# Patient Record
Sex: Male | Born: 1990 | Race: White | Hispanic: No | Marital: Married | Smoking: Former smoker
Health system: Southern US, Community
[De-identification: ages and names within clinical notes are randomized; demographics above are authoritative.]

---

## 2012-03-31 ENCOUNTER — Emergency Department: Payer: Self-pay | Admitting: Emergency Medicine

## 2013-01-31 ENCOUNTER — Emergency Department: Payer: Self-pay | Admitting: Emergency Medicine

## 2013-08-20 ENCOUNTER — Emergency Department: Payer: Self-pay | Admitting: Emergency Medicine

## 2013-10-22 ENCOUNTER — Emergency Department: Payer: Self-pay | Admitting: Emergency Medicine

## 2013-10-25 LAB — BETA STREP CULTURE(ARMC)

## 2013-12-09 ENCOUNTER — Emergency Department: Payer: Self-pay | Admitting: Emergency Medicine

## 2013-12-09 LAB — BASIC METABOLIC PANEL
Anion Gap: 4 — ABNORMAL LOW (ref 7–16)
BUN: 15 mg/dL (ref 7–18)
CREATININE: 1.1 mg/dL (ref 0.60–1.30)
Calcium, Total: 8.9 mg/dL (ref 8.5–10.1)
Chloride: 106 mmol/L (ref 98–107)
Co2: 30 mmol/L (ref 21–32)
EGFR (African American): >60
EGFR (Non-African Amer.): >60
GLUCOSE: 109 mg/dL — AB (ref 65–99)
Osmolality: 281 (ref 275–301)
POTASSIUM: 4.4 mmol/L (ref 3.5–5.1)
Sodium: 140 mmol/L (ref 136–145)

## 2013-12-09 LAB — CBC WITH DIFFERENTIAL/PLATELET
Basophil #: 0 10*3/uL (ref 0.0–0.1)
Basophil %: 0.2 %
EOS PCT: 0.3 %
Eosinophil #: 0 10*3/uL (ref 0.0–0.7)
HCT: 52.1 % — ABNORMAL HIGH (ref 40.0–52.0)
HGB: 16.9 g/dL (ref 13.0–18.0)
LYMPHS ABS: 0.2 10*3/uL — AB (ref 1.0–3.6)
Lymphocyte %: 1.4 %
MCH: 28.4 pg (ref 26.0–34.0)
MCHC: 32.4 g/dL (ref 32.0–36.0)
MCV: 88 fL (ref 80–100)
MONOS PCT: 4.5 %
Monocyte #: 0.7 x10 3/mm (ref 0.2–1.0)
NEUTROS ABS: 15.4 10*3/uL — AB (ref 1.4–6.5)
NEUTROS PCT: 93.6 %
PLATELETS: 220 10*3/uL (ref 150–440)
RBC: 5.95 10*6/uL — ABNORMAL HIGH (ref 4.40–5.90)
RDW: 13.3 % (ref 11.5–14.5)
WBC: 16.4 10*3/uL — AB (ref 3.8–10.6)

## 2013-12-09 LAB — LIPASE, BLOOD: Lipase: 140 U/L (ref 73–393)

## 2014-05-14 ENCOUNTER — Emergency Department: Payer: Self-pay | Admitting: Emergency Medicine

## 2015-03-28 ENCOUNTER — Encounter: Payer: Self-pay | Admitting: Emergency Medicine

## 2015-03-28 ENCOUNTER — Emergency Department
Admission: EM | Admit: 2015-03-28 | Discharge: 2015-03-28 | Disposition: A | Discharge status: Home / Self Care | Payer: Self-pay | Attending: Emergency Medicine | Admitting: Emergency Medicine

## 2015-03-28 DIAGNOSIS — H9192 Unspecified hearing loss, left ear: Secondary | ICD-10-CM | POA: Insufficient documentation

## 2015-03-28 DIAGNOSIS — Z87891 Personal history of nicotine dependence: Secondary | ICD-10-CM | POA: Insufficient documentation

## 2015-03-28 NOTE — ED Notes (Signed)
Pt c/o muffled hearing in his left ear for about a month; denies pain; says his brother in law made him come tonight; pt in no acute distress

## 2015-03-31 DIAGNOSIS — H6123 Impacted cerumen, bilateral: Secondary | ICD-10-CM | POA: Insufficient documentation

## 2015-03-31 DIAGNOSIS — L237 Allergic contact dermatitis due to plants, except food: Secondary | ICD-10-CM | POA: Insufficient documentation

## 2015-03-31 DIAGNOSIS — Z87891 Personal history of nicotine dependence: Secondary | ICD-10-CM | POA: Insufficient documentation

## 2015-03-31 NOTE — ED Notes (Signed)
Right ear muffled for few days, has had nasal congestion.

## 2015-04-01 ENCOUNTER — Emergency Department
Admission: EM | Admit: 2015-04-01 | Discharge: 2015-04-01 | Disposition: A | Discharge status: Home / Self Care | Payer: Self-pay | Attending: Emergency Medicine | Admitting: Emergency Medicine

## 2015-04-01 DIAGNOSIS — L237 Allergic contact dermatitis due to plants, except food: Secondary | ICD-10-CM

## 2015-04-01 DIAGNOSIS — H6123 Impacted cerumen, bilateral: Secondary | ICD-10-CM

## 2015-04-01 MED ORDER — PREDNISONE 20 MG PO TABS
60.0000 mg | ORAL_TABLET | Freq: Once | ORAL | Status: AC
  Administered 2015-04-01: 60 mg via ORAL
  Filled 2015-04-01: qty 3

## 2015-04-01 MED ORDER — CARBAMIDE PEROXIDE 6.5 % OT SOLN: 5.0000 [drp] | Freq: Two times a day (BID) | OTIC | Status: AC

## 2015-04-01 MED ORDER — PREDNISONE 20 MG PO TABS: 60.0000 mg | ORAL_TABLET | Freq: Every day | ORAL | Status: DC

## 2015-04-01 NOTE — Discharge Instructions (Signed)
Contact Dermatitis °Dermatitis is redness, soreness, and swelling (inflammation) of the skin. Contact dermatitis is a reaction to certain substances that touch the skin. There are two types of contact dermatitis:  °· Irritant contact dermatitis. This type is caused by something that irritates your skin, such as dry hands from washing them too much. This type does not require previous exposure to the substance for a reaction to occur. This type is more common. °· Allergic contact dermatitis. This type is caused by a substance that you are allergic to, such as a nickel allergy or poison ivy. This type only occurs if you have been exposed to the substance (allergen) before. Upon a repeat exposure, your body reacts to the substance. This type is less common. °CAUSES  °Many different substances can cause contact dermatitis. Irritant contact dermatitis is most commonly caused by exposure to:  °· Makeup.   °· Soaps.   °· Detergents.   °· Bleaches.   °· Acids.   °· Metal salts, such as nickel.   °Allergic contact dermatitis is most commonly caused by exposure to:  °· Poisonous plants.   °· Chemicals.   °· Jewelry.   °· Latex.   °· Medicines.   °· Preservatives in products, such as clothing.   °RISK FACTORS °This condition is more likely to develop in:  °· People who have jobs that expose them to irritants or allergens. °· People who have certain medical conditions, such as asthma or eczema.   °SYMPTOMS  °Symptoms of this condition may occur anywhere on your body where the irritant has touched you or is touched by you. Symptoms include: °· Dryness or flaking.   °· Redness.   °· Cracks.   °· Itching.   °· Pain or a burning feeling.   °· Blisters. °· Drainage of small amounts of blood or clear fluid from skin cracks. °With allergic contact dermatitis, there may also be swelling in areas such as the eyelids, mouth, or genitals.  °DIAGNOSIS  °This condition is diagnosed with a medical history and physical exam. A patch skin test  may be performed to help determine the cause. If the condition is related to your job, you may need to see an occupational medicine specialist. °TREATMENT °Treatment for this condition includes figuring out what caused the reaction and protecting your skin from further contact. Treatment may also include:  °· Steroid creams or ointments. Oral steroid medicines may be needed in more severe cases. °· Antibiotics or antibacterial ointments, if a skin infection is present. °· Antihistamine lotion or an antihistamine taken by mouth to ease itching. °· A bandage (dressing). °HOME CARE INSTRUCTIONS °Skin Care  °· Moisturize your skin as needed.   °· Apply cool compresses to the affected areas. °· Try taking a bath with: °¨ Epsom salts. Follow the instructions on the packaging. You can get these at your local pharmacy or grocery store. °¨ Baking soda. Pour a small amount into the bath as directed by your health care provider. °¨ Colloidal oatmeal. Follow the instructions on the packaging. You can get this at your local pharmacy or grocery store. °· Try applying baking soda paste to your skin. Stir water into baking soda until it reaches a paste-like consistency. °· Do not scratch your skin. °· Bathe less frequently, such as every other day. °· Bathe in lukewarm water. Avoid using hot water. °Medicines  °· Take or apply over-the-counter and prescription medicines only as told by your health care provider.   °· If you were prescribed an antibiotic medicine, take or apply your antibiotic as told by your health care provider. Do not stop using the   antibiotic even if your condition starts to improve. °General Instructions  °· Keep all follow-up visits as told by your health care provider. This is important. °· Avoid the substance that caused your reaction. If you do not know what caused it, keep a journal to try to track what caused it. Write down: °¨ What you eat. °¨ What cosmetic products you use. °¨ What you drink. °¨ What  you wear in the affected area. This includes jewelry. °· If you were given a dressing, take care of it as told by your health care provider. This includes when to change and remove it. °SEEK MEDICAL CARE IF:  °· Your condition does not improve with treatment. °· Your condition gets worse. °· You have signs of infection such as swelling, tenderness, redness, soreness, or warmth in the affected area. °· You have a fever. °· You have new symptoms. °SEEK IMMEDIATE MEDICAL CARE IF:  °· You have a severe headache, neck pain, or neck stiffness. °· You vomit. °· You feel very sleepy. °· You notice red streaks coming from the affected area. °· Your bone or joint underneath the affected area becomes painful after the skin has healed. °· The affected area turns darker. °· You have difficulty breathing. °  °This information is not intended to replace advice given to you by your health care provider. Make sure you discuss any questions you have with your health care provider. °  °Document Released: 01/29/2000 Document Revised: 10/22/2014 Document Reviewed: 06/18/2014 °Elsevier Interactive Patient Education ©2016 [employer] ° °Poison Ivy °Poison ivy is a inflammation of the skin (contact dermatitis) caused by touching the allergens on the leaves of the ivy plant following previous exposure to the plant. The rash usually appears 48 hours after exposure. The rash is usually bumps (papules) or blisters (vesicles) in a linear pattern. Depending on your own sensitivity, the rash may simply cause redness and itching, or it may also progress to blisters which may break open. These must be well cared for to prevent secondary bacterial (germ) infection, followed by scarring. Keep any open areas dry, clean, dressed, and covered with an antibacterial ointment if needed. The eyes may also get puffy. The puffiness is worst in the morning and gets better as the day progresses. This dermatitis usually heals without scarring, within 2 to 3  weeks without treatment. °HOME CARE INSTRUCTIONS  °Thoroughly wash with soap and water as soon as you have been exposed to poison ivy. You have about one half hour to remove the plant resin before it will cause the rash. This washing will destroy the oil or antigen on the skin that is causing, or will cause, the rash. Be sure to wash under your fingernails as any plant resin there will continue to spread the rash. Do not rub skin vigorously when washing affected area. Poison ivy cannot spread if no oil from the plant remains on your body. A rash that has progressed to weeping sores will not spread the rash unless you have not washed thoroughly. It is also important to wash any clothes you have been wearing as these may carry active allergens. The rash will return if you wear the unwashed clothing, even several days later. °Avoidance of the plant in the future is the best measure. Poison ivy plant can be recognized by the number of leaves. Generally, poison ivy has three leaves with flowering branches on a single stem. °Diphenhydramine may be purchased over the counter and used as needed for itching. Do not   drive with this medication if it makes you drowsy.Ask your caregiver about medication for children. SEEK MEDICAL CARE IF:  Open sores develop.  Redness spreads beyond area of rash.  You notice purulent (pus-like) discharge.  You have increased pain.  Other signs of infection develop (such as fever).   This information is not intended to replace advice given to you by your health care provider. Make sure you discuss any questions you have with your health care provider.   Document Released: 01/29/2000 Document Revised: 04/25/2011 Document Reviewed: 07/09/2014 Elsevier Interactive Patient Education 2016 [employer] Inc[employer]  Cerumen Impaction The structures of the external ear canal secrete a waxy substance known as cerumen. Excess cerumen can build up in the ear canal, causing a condition known as  cerumen impaction. Cerumen impaction can cause ear pain and disrupt the function of the ear. The rate of cerumen production differs for each individual. In certain individuals, the configuration of the ear canal may decrease his or her ability to naturally remove cerumen. CAUSES Cerumen impaction is caused by excessive cerumen production or buildup. RISK FACTORS  Frequent use of swabs to clean ears.  Having narrow ear canals.  Having eczema.  Being dehydrated. SIGNS AND SYMPTOMS  Diminished hearing.  Ear drainage.  Ear pain.  Ear itch. TREATMENT Treatment may involve:  Over-the-counter or prescription ear drops to soften the cerumen.  Removal of cerumen by a health care provider. This may be done with:  Irrigation with warm water. This is the most common method of removal.  Ear curettes and other instruments.  Surgery. This may be done in severe cases. HOME CARE INSTRUCTIONS  Take medicines only as directed by your health care provider.  Do not insert objects into the ear with the intent of cleaning the ear. PREVENTION  Do not insert objects into the ear, even with the intent of cleaning the ear. Removing cerumen as a part of normal hygiene is not necessary, and the use of swabs in the ear canal is not recommended.  Drink enough water to keep your urine clear or pale yellow.  Control your eczema if you have it. SEEK MEDICAL CARE IF:  You develop ear pain.  You develop bleeding from the ear.  The cerumen does not clear after you use ear drops as directed.   This information is not intended to replace advice given to you by your health care provider. Make sure you discuss any questions you have with your health care provider.   Document Released: 03/10/2004 Document Revised: 02/21/2014 Document Reviewed: 09/17/2014 Elsevier Interactive Patient Education [employer]

## 2015-04-01 NOTE — ED Provider Notes (Signed)
[facility] Emergency Department Provider Note  ____________________________________________  Time seen: Approximately 0057 AM  I have reviewed the triage vital signs and the nursing notes.   HISTORY  Chief Complaint Ear Fullness    HPI Craig Berg is a 25 y.o. male who comes into the hospital today with weird sensation in his ear. The patient reports that he's been feeling this for the past 1-2 months. He reports that he can hardly hear out of both of his ears. His left worse than right. He also reports that he's had a bad reaction to poison oak and poison ivy in the past and has another exposure and would like to get that taken care of. He reports that when he puts in his headphones the sounds are muffled and then he has also turned up the television loudly to hear it. The patient was unsure if it was due to sinuses or something else and he wanted to get it checked out. The patient is currently having no pain he's had no fevers. He reports that he does have the rash to his face and legs and arms. He is here for evaluation.   No past medical history  There are no active problems to display for this patient.   No past surgical history  Current Outpatient Rx  Name  Route  Sig  Dispense  Refill  . carbamide peroxide (DEBROX) 6.5 % otic solution   Right Ear   Place 5 drops into the right ear 2 (two) times daily.   15 mL   0   . predniSONE (DELTASONE) 20 MG tablet   Oral   Take 3 tablets (60 mg total) by mouth daily.   12 tablet   0     Allergies Review of patient's allergies indicates no known allergies.  No family history on file.  Social History Social History  Substance Use Topics  . Smoking status: Former Games developer  . Smokeless tobacco: Not on file  . Alcohol Use: Yes    Review of Systems Constitutional: No fever/chills Eyes: No visual changes. ENT: Decreased hearing Cardiovascular: Denies chest pain. Respiratory: Denies shortness of  breath. Gastrointestinal: No abdominal pain.  No nausea, no vomiting.  No diarrhea.  No constipation. Genitourinary: Negative for dysuria. Musculoskeletal: Negative for back pain. Skin:  rash. Neurological: Negative for headaches, focal weakness or numbness.  10-point ROS otherwise negative.  ____________________________________________   PHYSICAL EXAM:  VITAL SIGNS: ED Triage Vitals  Enc Vitals Group     BP 03/31/15 2312 159/91 mmHg     Pulse Rate 03/31/15 2312 89     Resp 03/31/15 2312 18     Temp 03/31/15 2312 97.8 F (36.6 C)     Temp Source 03/31/15 2312 Oral     SpO2 03/31/15 2312 97 %     Weight 03/31/15 2312 210 lb (95.255 kg)     Height --      Head Cir --      Peak Flow --      Pain Score 03/31/15 2312 0     Pain Loc --      Pain Edu? --      Excl. in GC? --     Constitutional: Alert and oriented. Well appearing and in no acute distress. Eyes: Conjunctivae are normal. PERRL. EOMI. Ears: Cerumen impaction bilaterally Head: Atraumatic. Nose: No congestion/rhinnorhea. Mouth/Throat: Mucous membranes are moist.  Oropharynx non-erythematous. Cardiovascular: Normal rate, regular rhythm. Grossly normal heart sounds.  Good peripheral circulation.  Respiratory: Normal respiratory effort.  No retractions. Lungs CTAB. Gastrointestinal: Soft and nontender. No distention. Positive bowel sounds Musculoskeletal: No lower extremity tenderness nor edema.   Neurologic:  Normal speech and language.  Skin:  Skin is warm, dry and intact. Maculopapular rash to the patient's arms and legs without any specific vesicles or bullae. Psychiatric: Mood and affect are normal.   ____________________________________________   LABS (all labs ordered are listed, but only abnormal results are displayed)  Labs Reviewed - No data to  display ____________________________________________  EKG  none ____________________________________________  RADIOLOGY  none ____________________________________________   PROCEDURES  Procedure(s) performed: None  Critical Care performed: No  ____________________________________________   INITIAL IMPRESSION / ASSESSMENT AND PLAN / ED COURSE  Pertinent labs & imaging results that were available during my care of the patient were reviewed by me and considered in my medical decision making (see chart for details).  This is a 25 year old male who comes into the hospital today with some decreased hearing in his ears and a rash. I will give the patient some prednisone for his poison ivy rash and I will attempt to clean out the patient cerumen impaction.  I attempted to clean out the patient's left ear with a curet. I did obtain copious amounts of cerumen but there was still some remaining. I also attempted to clean the patient's right ear that began was unable to clear his ear. The patient will be given a prescription for Debrox drops as well as prednisone and he'll be discharged to home. ____________________________________________   FINAL CLINICAL IMPRESSION(S) / ED DIAGNOSES  Final diagnoses:  Cerumen impaction, bilateral  Poison ivy      Rebecka Apley, MD 04/01/15 [phone]

## 2015-10-04 ENCOUNTER — Emergency Department
Admission: EM | Admit: 2015-10-04 | Discharge: 2015-10-04 | Disposition: A | Discharge status: Home / Self Care | Payer: Self-pay | Attending: Emergency Medicine | Admitting: Emergency Medicine

## 2015-10-04 ENCOUNTER — Encounter: Payer: Self-pay | Admitting: Emergency Medicine

## 2015-10-04 ENCOUNTER — Emergency Department: Payer: Self-pay

## 2015-10-04 DIAGNOSIS — Y999 Unspecified external cause status: Secondary | ICD-10-CM | POA: Insufficient documentation

## 2015-10-04 DIAGNOSIS — W2102XA Struck by soccer ball, initial encounter: Secondary | ICD-10-CM | POA: Insufficient documentation

## 2015-10-04 DIAGNOSIS — Y9366 Activity, soccer: Secondary | ICD-10-CM | POA: Insufficient documentation

## 2015-10-04 DIAGNOSIS — Z7952 Long term (current) use of systemic steroids: Secondary | ICD-10-CM | POA: Insufficient documentation

## 2015-10-04 DIAGNOSIS — S93105A Unspecified dislocation of left toe(s), initial encounter: Secondary | ICD-10-CM

## 2015-10-04 DIAGNOSIS — Y929 Unspecified place or not applicable: Secondary | ICD-10-CM | POA: Insufficient documentation

## 2015-10-04 DIAGNOSIS — Z87891 Personal history of nicotine dependence: Secondary | ICD-10-CM | POA: Insufficient documentation

## 2015-10-04 DIAGNOSIS — S93135A Subluxation of interphalangeal joint of left lesser toe(s), initial encounter: Secondary | ICD-10-CM | POA: Insufficient documentation

## 2015-10-04 DIAGNOSIS — Z79899 Other long term (current) drug therapy: Secondary | ICD-10-CM | POA: Insufficient documentation

## 2015-10-04 MED ORDER — TRAMADOL HCL 50 MG PO TABS: 50.0000 mg | ORAL_TABLET | Freq: Four times a day (QID) | ORAL | 0 refills | Status: DC | PRN

## 2015-10-04 NOTE — ED Triage Notes (Signed)
Pt states was playing soccer when he ran his foot into his sister's shoe. States he thinks his L pointer toe is dislocated. Some redness and swelling noted at this time.

## 2015-10-04 NOTE — ED Provider Notes (Signed)
[facility]-EMERGENCY DEPARTMENT Provider Note   CSN: [id number] Arrival date & time: 10/04/15  1951     History   Chief Complaint Chief Complaint  Patient presents with  . Toe Pain    HPI Craig Berg is a 25 y.o. male presents to the emergency department for evaluation of left second toe pain. Patient states around 7 PM tonight, he was playing soccer barefooted. Patient states he went to kick the ball, ended up kicking his sister's foot. Patient developed toe to the second PIP joint of the left foot. Patient's pain is moderate. No relief with ibuprofen. He has pain with weightbearing. He denies any numbness or tingling. No other injury to his body.  HPI  History reviewed. No pertinent past medical history.  There are no active problems to display for this patient.   History reviewed. No pertinent surgical history.     Home Medications    Prior to Admission medications   Medication Sig Start Date End Date Taking? Authorizing Provider  carbamide peroxide (DEBROX) 6.5 % otic solution Place 5 drops into the right ear 2 (two) times daily. 04/01/15 03/31/16  Rebecka ApleyAllison P Webster, MD  predniSONE (DELTASONE) 20 MG tablet Take 3 tablets (60 mg total) by mouth daily. 04/01/15   Rebecka ApleyAllison P Webster, MD  traMADol (ULTRAM) 50 MG tablet Take 1 tablet (50 mg total) by mouth every 6 (six) hours as needed. 10/04/15   Evon Slackhomas C Verbon Giangregorio, PA-C    Family History History reviewed. No pertinent family history.  Social History Social History  Substance Use Topics  . Smoking status: Former Games developermoker  . Smokeless tobacco: Former NeurosurgeonUser  . Alcohol use Yes     Allergies   Review of patient's allergies indicates no known allergies.   Review of Systems Review of Systems  Constitutional: Negative.  Negative for activity change, appetite change, chills and fever.  HENT: Negative for congestion, ear pain, mouth sores, rhinorrhea, sinus pressure, sore throat and trouble swallowing.   Eyes: Negative for  photophobia, pain and discharge.  Respiratory: Negative for cough, chest tightness and shortness of breath.   Cardiovascular: Negative for chest pain and leg swelling.  Gastrointestinal: Negative for abdominal distention, abdominal pain, diarrhea, nausea and vomiting.  Genitourinary: Negative for difficulty urinating and dysuria.  Musculoskeletal: Positive for arthralgias and joint swelling. Negative for back pain and gait problem.  Skin: Negative for color change and rash.  Neurological: Negative for dizziness and headaches.  Hematological: Negative for adenopathy.  Psychiatric/Behavioral: Negative for agitation and behavioral problems.     Physical Exam Updated Vital Signs BP 132/72 (BP Location: Left Arm)   Pulse 92   Temp 97.6 F (36.4 C) (Oral)   Resp 18   Ht 5\' 11"  (1.803 m)   Wt 90.7 kg   SpO2 96%   BMI 27.89 kg/m   Physical Exam  Constitutional: He appears well-developed and well-nourished.  HENT:  Head: Normocephalic and atraumatic.  Eyes: Conjunctivae are normal.  Neck: Neck supple.  Cardiovascular: Normal rate.   No murmur heard. Pulmonary/Chest: Effort normal. No respiratory distress.  Musculoskeletal:  Examination of the left second toe shows patient is tender to palpation. There is a small deformity that is visible and palpable. There is no nail trauma. Sensation is intact distally. No skin breakdown noted.  Neurological: He is alert.  Skin: Skin is warm and dry.  Psychiatric: He has a normal mood and affect.  Nursing note and vitals reviewed.    ED Treatments / Results  Labs (  all labs ordered are listed, but only abnormal results are displayed) Labs Reviewed - No data to display  EKG  EKG Interpretation None       Radiology Dg Foot Complete Left  Result Date: 10/04/2015 CLINICAL DATA:  Left second toe pain after injury playing soccer today. EXAM: LEFT FOOT - COMPLETE 3+ VIEW COMPARISON:  None. FINDINGS: There is dorsal subluxation of the  second toe at the proximal interphalangeal joint with the middle phalanx perched dorsally on the proximal phalanx. Small osseous fragment adjacent to the lateral joint line, donor site likely from the middle phalanx. There is a questionable osseous density adjacent to the medial joint line. Alignment partially obscured on the lateral view due to overlapping osseous and soft tissue structures. No additional acute fracture or subluxation of the foot. IMPRESSION: Dorsal subluxation of the second toe at the proximal interphalangeal joint. Probable tiny fracture fragment in the lateral joint line, donor site likely the middle phalanx. Additional questionable osseous fragment adjacent to the medial joint line, seen only on a single view. Electronically Signed   By: Rubye OaksMelanie  Ehinger M.D.   On: 10/04/2015 21:02   Dg Toe 2nd Left  Result Date: 10/04/2015 CLINICAL DATA:  Post reduction. EXAM: LEFT SECOND TOE COMPARISON:  October 04, 2015 FINDINGS: The second toe dislocations/subluxation has been reduced. Tiny calcifications both medially and laterally adjacent to the proximal interphalangeal joint are probably small fracture fragments. No other abnormalities. IMPRESSION: Interval reduction of the second toe subluxation/dislocation. Tiny adjacent calcifications are likely small fracture fragments. Electronically Signed   By: Gerome Samavid  Williams III M.D   On: 10/04/2015 21:13    Procedures Procedures (including critical care time) Closed reduction left second toe: Risks, benefits, complications of a closed reduction to the left second toe were discussed with the patient. Patient agreed and consented to the procedure. Discussed benefits of digital block with patient, patient elected not to pursue digital block. Traction was applied to the left second toe and volar placement was applied. Patient tolerated procedure well.  Medications Ordered in ED Medications - No data to display   Initial Impression / Assessment and  Plan / ED Course  I have reviewed the triage vital signs and the nursing notes.  Pertinent labs & imaging results that were available during my care of the patient were reviewed by me and considered in my medical decision making (see chart for details).  Clinical Course    25 year old male with left second PIP toe dislocation. Patient tolerated closed reduction well. There is mild calcifications along the reduction site. Patient's toes were buddy taped. His given tramadol for pain. Follow-up with podiatry.  Final Clinical Impressions(s) / ED Diagnoses   Final diagnoses:  Toe dislocation, left, initial encounter    New Prescriptions New Prescriptions   TRAMADOL (ULTRAM) 50 MG TABLET    Take 1 tablet (50 mg total) by mouth every 6 (six) hours as needed.     Evon Slackhomas C Vineta Carone, PA-C 10/04/15 2132    Sharyn CreamerMark Quale, MD 10/04/15 2351

## 2015-10-04 NOTE — ED Notes (Signed)
NAD noted at time of D/C. Pt rolled himself out to lobby in a wheelchair, refused assistance at this time. Denies comments/concerns.

## 2015-10-04 NOTE — Discharge Instructions (Signed)
Please rest ice and elevate toe. Buddy tape toes daily for 2-3 weeks. Ibuprofen as needed for pain

## 2018-02-18 IMAGING — DX DG TOE 2ND 2+V*L*
3 series · 3 of 3 positions shown · non-contrast
Comparison: October 04, 2015

CLINICAL DATA: Post reduction.

EXAM:
LEFT SECOND TOE

[toe ap]
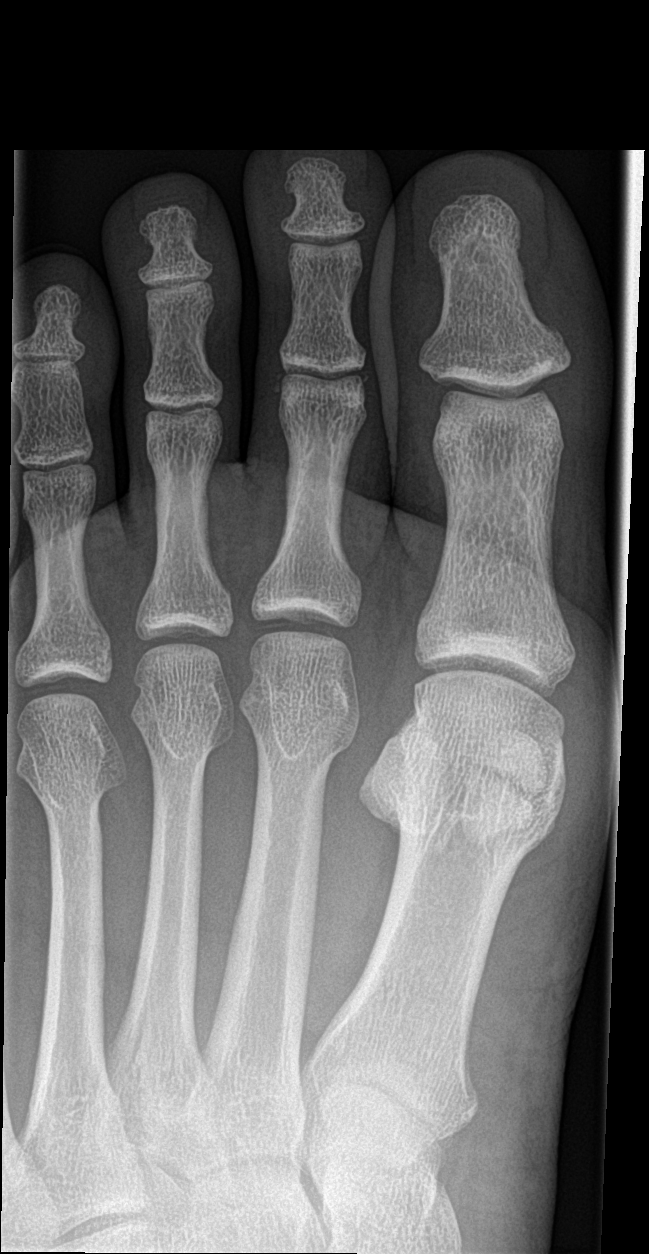

[toe obl]
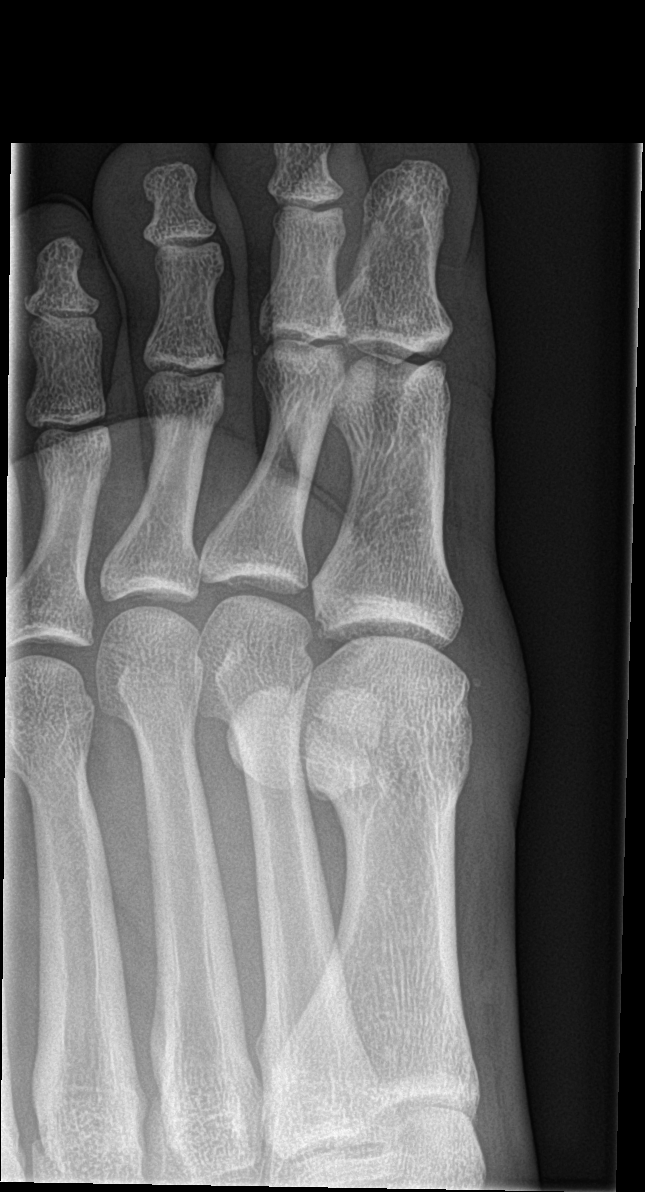

[toe lat]
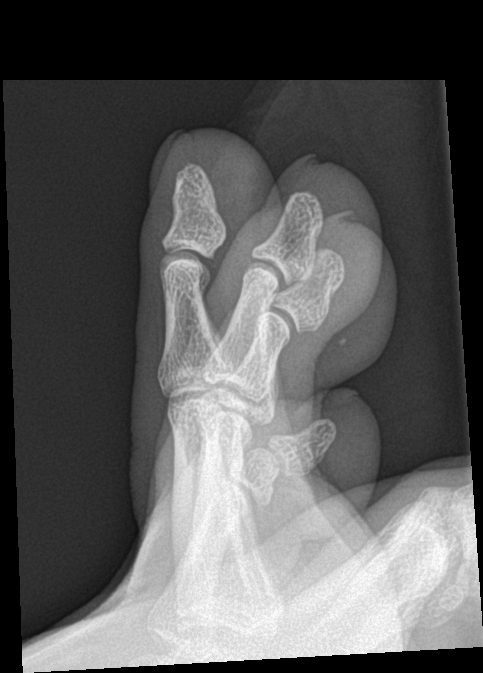

[3 of 3 positions shown; findings below may reference images not displayed]

FINDINGS: The second toe dislocations/subluxation has been reduced. Tiny
calcifications both medially and laterally adjacent to the proximal
interphalangeal joint are probably small fracture fragments. No
other abnormalities.
IMPRESSION: Interval reduction of the second toe subluxation/dislocation. Tiny
adjacent calcifications are likely small fracture fragments.

## 2018-02-18 IMAGING — DX DG FOOT COMPLETE 3+V*L*
3 series · 3 of 3 positions shown · non-contrast
Comparison: None.

CLINICAL DATA: Left second toe pain after injury playing soccer
today.

EXAM:
LEFT FOOT - COMPLETE 3+ VIEW

[foot ap]
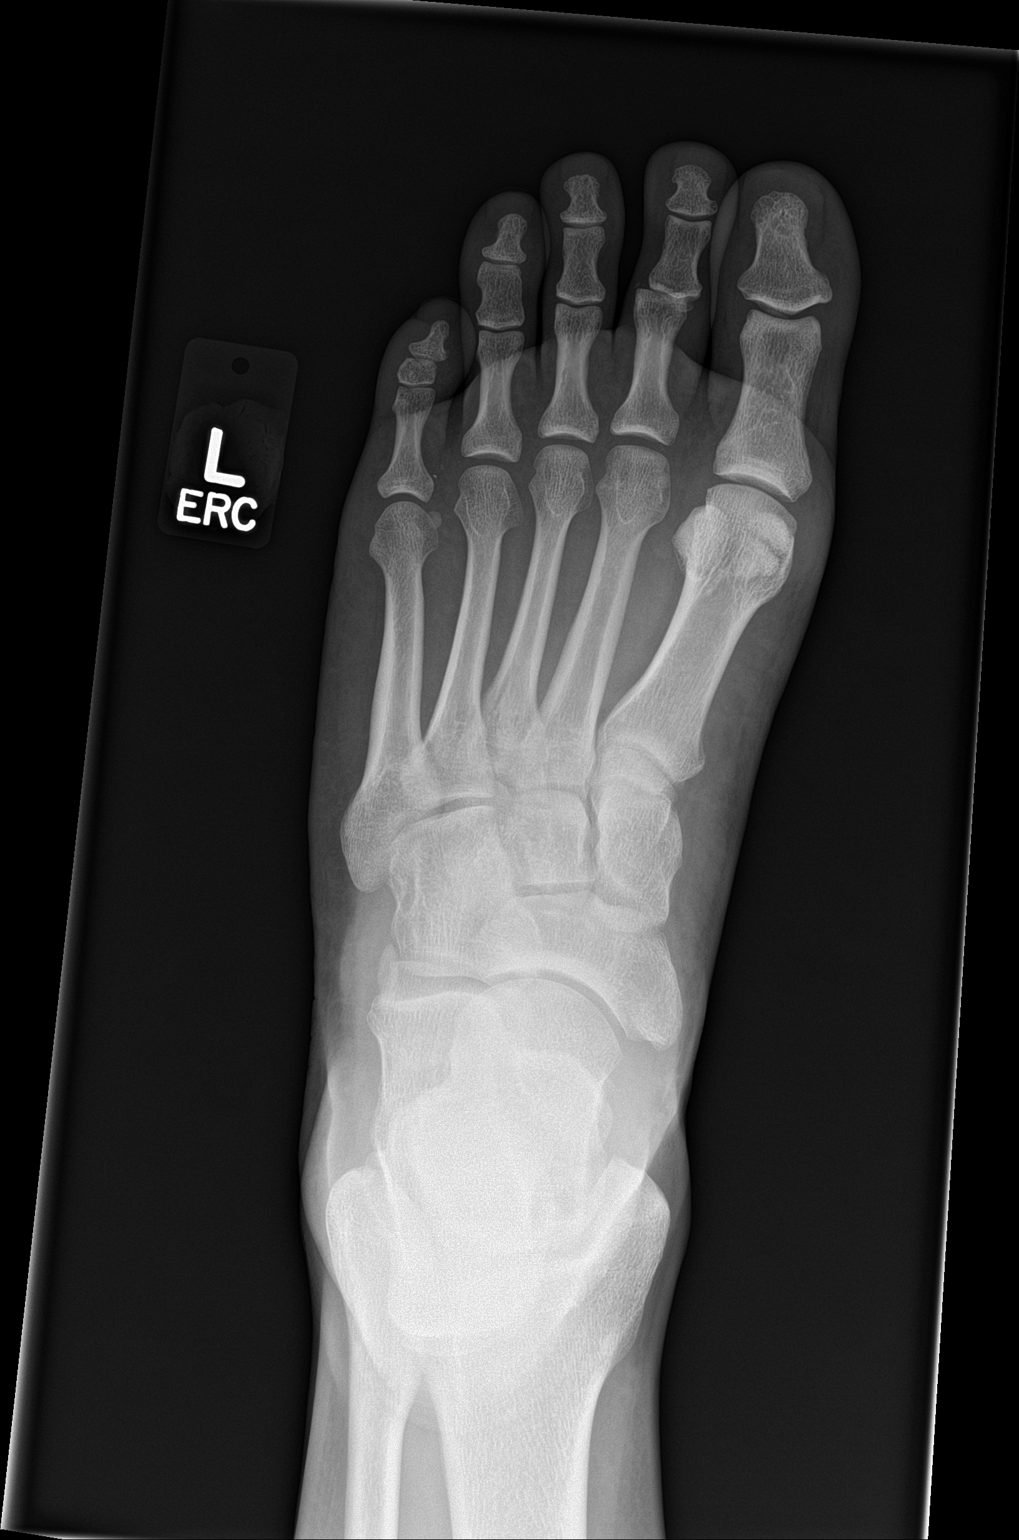

[foot obl]
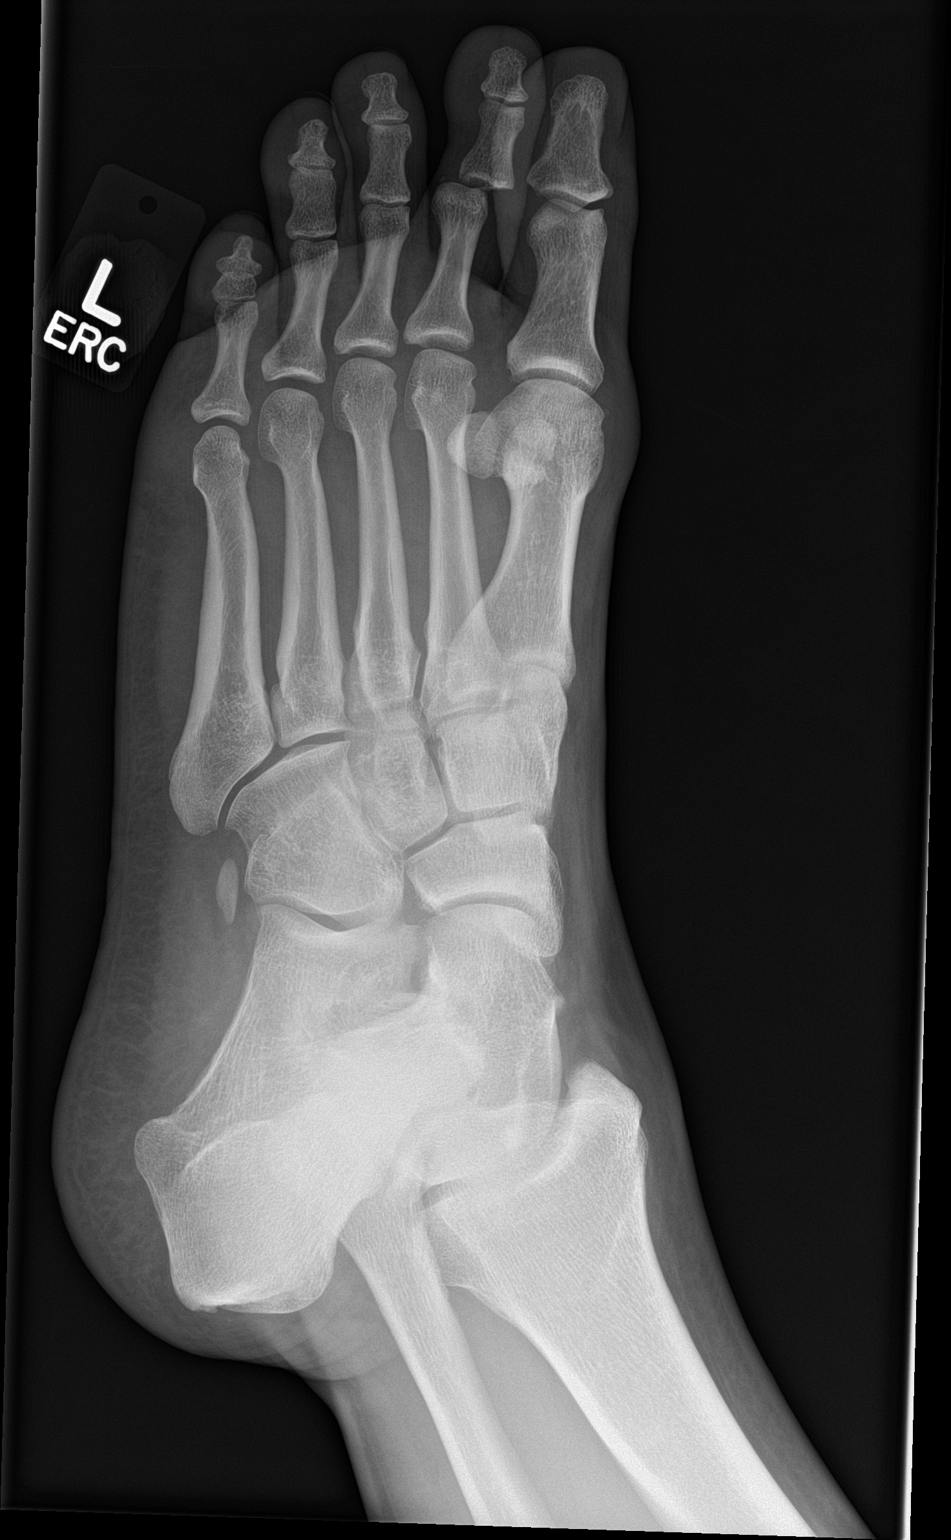

[foot lat]
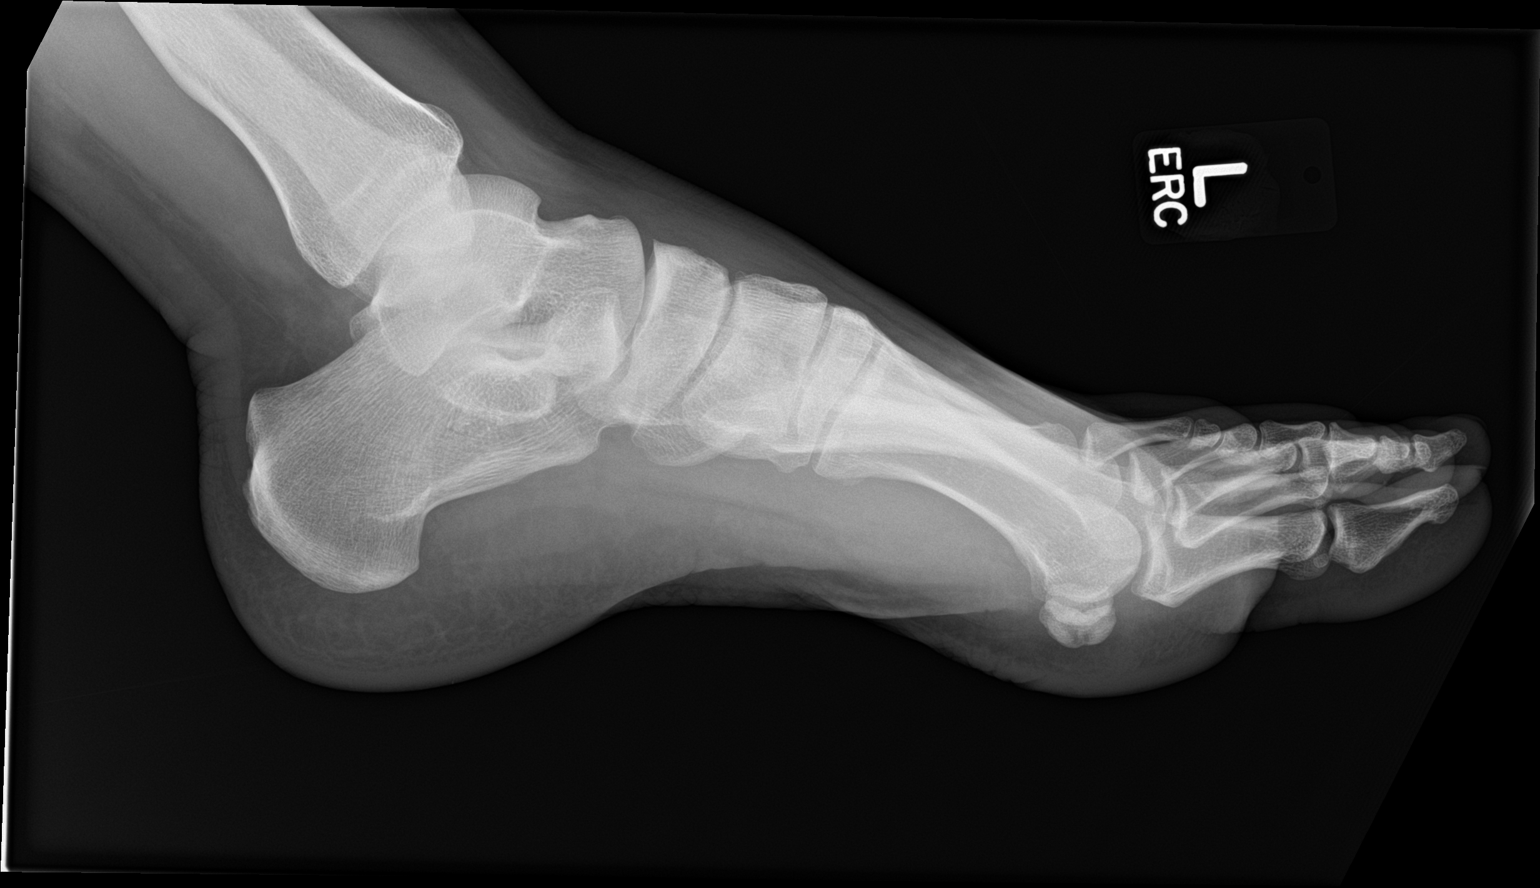

[3 of 3 positions shown; findings below may reference images not displayed]

FINDINGS: There is dorsal subluxation of the second toe at the proximal
interphalangeal joint with the middle phalanx perched dorsally on
the proximal phalanx. Small osseous fragment adjacent to the lateral
joint line, donor site likely from the middle phalanx. There is a
questionable osseous density adjacent to the medial joint line.
Alignment partially obscured on the lateral view due to overlapping
osseous and soft tissue structures. No additional acute fracture or
subluxation of the foot.
IMPRESSION: Dorsal subluxation of the second toe at the proximal interphalangeal
joint. Probable tiny fracture fragment in the lateral joint line,
donor site likely the middle phalanx. Additional questionable
osseous fragment adjacent to the medial joint line, seen only on a
single view.

## 2020-01-17 ENCOUNTER — Other Ambulatory Visit: Payer: Self-pay

## 2020-01-17 ENCOUNTER — Encounter: Payer: Self-pay | Admitting: Emergency Medicine

## 2020-01-17 ENCOUNTER — Emergency Department
Admission: EM | Admit: 2020-01-17 | Discharge: 2020-01-17 | Disposition: A | Discharge status: Home / Self Care | Payer: Self-pay | Attending: Emergency Medicine | Admitting: Emergency Medicine

## 2020-01-17 DIAGNOSIS — Z23 Encounter for immunization: Secondary | ICD-10-CM | POA: Insufficient documentation

## 2020-01-17 DIAGNOSIS — S0101XA Laceration without foreign body of scalp, initial encounter: Secondary | ICD-10-CM | POA: Insufficient documentation

## 2020-01-17 DIAGNOSIS — Z87891 Personal history of nicotine dependence: Secondary | ICD-10-CM | POA: Insufficient documentation

## 2020-01-17 DIAGNOSIS — W268XXA Contact with other sharp object(s), not elsewhere classified, initial encounter: Secondary | ICD-10-CM | POA: Insufficient documentation

## 2020-01-17 MED ORDER — TETANUS-DIPHTH-ACELL PERTUSSIS 5-2.5-18.5 LF-MCG/0.5 IM SUSY
0.5000 mL | PREFILLED_SYRINGE | Freq: Once | INTRAMUSCULAR | Status: AC
  Administered 2020-01-17: 0.5 mL via INTRAMUSCULAR
  Filled 2020-01-17: qty 0.5

## 2020-01-17 MED ORDER — CEPHALEXIN 500 MG PO CAPS: 1000.0000 mg | ORAL_CAPSULE | Freq: Two times a day (BID) | ORAL | 0 refills | Status: DC

## 2020-01-17 NOTE — ED Provider Notes (Signed)
[facility] Emergency Department Provider Note  ____________________________________________  Time seen: Approximately 6:58 PM  I have reviewed the triage vital signs and the nursing notes.   HISTORY  Chief Complaint Laceration    HPI Craig Berg is a 29 y.o. male presents the emergency department for evaluation of a scalp laceration. Patient was under a crawl space, was turning around when he struck his head on an exposed piece of metal. Patient sustained a laceration in the left parietal scalp region. Initially this bled quite heavily but he was able to control bleeding with direct pressure. Patient did not lose consciousness. He denies any headache. He presents for evaluation of the wound and for tetanus shot. No other injuries or complaints. Patient cleaned the area prior to arrival.         History reviewed. No pertinent past medical history.  There are no problems to display for this patient.   History reviewed. No pertinent surgical history.  Prior to Admission medications   Medication Sig Start Date End Date Taking? Authorizing Provider  cephALEXin (KEFLEX) 500 MG capsule Take 2 capsules (1,000 mg total) by mouth 2 (two) times daily. 01/17/20   Macala Baldonado, Delorise Royals, PA-C  predniSONE (DELTASONE) 20 MG tablet Take 3 tablets (60 mg total) by mouth daily. 04/01/15   Rebecka Apley, MD  traMADol (ULTRAM) 50 MG tablet Take 1 tablet (50 mg total) by mouth every 6 (six) hours as needed. 10/04/15   Evon Slack, PA-C    Allergies Patient has no known allergies.  No family history on file.  Social History Social History   Tobacco Use  . Smoking status: Former Games developer  . Smokeless tobacco: Former Engineer, water Use Topics  . Alcohol use: Yes  . Drug use: No     Review of Systems  Constitutional: No fever/chills Eyes: No visual changes. No discharge ENT: No upper respiratory complaints. Cardiovascular: no chest pain. Respiratory: no  cough. No SOB. Gastrointestinal: No abdominal pain.  No nausea, no vomiting.  No diarrhea.  No constipation. Musculoskeletal: Negative for musculoskeletal pain. Skin: Positive for scalp laceration. Patient presented to the emergency department with left scalp laceration Neurological: Negative for headaches, focal weakness or numbness.  10 System ROS otherwise negative.  ____________________________________________   PHYSICAL EXAM:  VITAL SIGNS: ED Triage Vitals  Enc Vitals Group     BP 01/17/20 1830 (!) 141/79     Pulse Rate 01/17/20 1830 93     Resp 01/17/20 1830 16     Temp 01/17/20 1830 98.2 F (36.8 C)     Temp Source 01/17/20 1830 Oral     SpO2 01/17/20 1830 95 %     Weight 01/17/20 1701 199 lb 15.3 oz (90.7 kg)     Height 01/17/20 1701 5\' 11"  (1.803 m)     Head Circumference --      Peak Flow --      Pain Score 01/17/20 1701 0     Pain Loc --      Pain Edu? --      Excl. in GC? --      Constitutional: Alert and oriented. Well appearing and in no acute distress. Eyes: Conjunctivae are normal. PERRL. EOMI. Head: Visualization of the left parietal scalp reveals superficial laceration measuring approximately 3 cm in length. Edges are well approximated. Scab in place. No active bleeding. No visible foreign body. Nontender to palpation over the osseous structures of the scalp. No palpable abnormality or crepitus. ENT:  Ears:       Nose: No congestion/rhinnorhea.      Mouth/Throat: Mucous membranes are moist.  Neck: No stridor.    Cardiovascular: Normal rate, regular rhythm. Normal S1 and S2.  Good peripheral circulation. Respiratory: Normal respiratory effort without tachypnea or retractions. Lungs CTAB. Good air entry to the bases with no decreased or absent breath sounds. Musculoskeletal: Full range of motion to all extremities. No gross deformities appreciated. Neurologic:  Normal speech and language. No gross focal neurologic deficits are appreciated.  Skin:   Skin is warm, dry and intact. No rash noted. Psychiatric: Mood and affect are normal. Speech and behavior are normal. Patient exhibits appropriate insight and judgement.   ____________________________________________   LABS (all labs ordered are listed, but only abnormal results are displayed)  Labs Reviewed - No data to display ____________________________________________  EKG   ____________________________________________  RADIOLOGY   No results found.  ____________________________________________    PROCEDURES  Procedure(s) performed:    Procedures    Medications  Tdap (BOOSTRIX) injection 0.5 mL (has no administration in time range)     ____________________________________________   INITIAL IMPRESSION / ASSESSMENT AND PLAN / ED COURSE  Pertinent labs & imaging results that were available during my care of the patient were reviewed by me and considered in my medical decision making (see chart for details).  Review of the [place] CSRS was performed in accordance of the [facility] prior to dispensing any controlled drugs.           Patient's diagnosis is consistent with scalp laceration. Patient presented to emergency department with a complaint of laceration to the left parietal scalp. Evaluation of this area revealed that this did not require closure as the edges are well approximated, does not appear deep. No bleeding. No indication for imaging. Patient will have his tetanus shot updated.Marland Kitchen Antibiotics prophylactically. Follow-up primary care as needed. Patient is given ED precautions to return to the ED for any worsening or new symptoms.     ____________________________________________  FINAL CLINICAL IMPRESSION(S) / ED DIAGNOSES  Final diagnoses:  Laceration of scalp, initial encounter      NEW MEDICATIONS STARTED DURING THIS VISIT:  ED Discharge Orders         Ordered    cephALEXin (KEFLEX) 500 MG capsule  2 times daily        01/17/20 1909               This chart was dictated using voice recognition software/Dragon. Despite best efforts to proofread, errors can occur which can change the meaning. Any change was purely unintentional.    Racheal Patches, PA-C 01/17/20 1909    Phineas Semen, MD 01/18/20 1515

## 2020-01-17 NOTE — ED Triage Notes (Signed)
Laceration to left head.  Patient states he cut it with metal.  Bleeding controlled.  Wound clean and dry.  NAD.

## 2021-08-29 ENCOUNTER — Other Ambulatory Visit: Payer: Self-pay

## 2021-08-29 ENCOUNTER — Emergency Department: Payer: Self-pay

## 2021-08-29 ENCOUNTER — Emergency Department
Admission: EM | Admit: 2021-08-29 | Discharge: 2021-08-29 | Disposition: A | Discharge status: Home / Self Care | Payer: Self-pay | Attending: Emergency Medicine | Admitting: Emergency Medicine

## 2021-08-29 DIAGNOSIS — X500XXA Overexertion from strenuous movement or load, initial encounter: Secondary | ICD-10-CM | POA: Insufficient documentation

## 2021-08-29 DIAGNOSIS — S63654A Sprain of metacarpophalangeal joint of right ring finger, initial encounter: Secondary | ICD-10-CM | POA: Insufficient documentation

## 2021-08-29 DIAGNOSIS — Y93F2 Activity, caregiving, lifting: Secondary | ICD-10-CM | POA: Insufficient documentation

## 2021-08-29 MED ORDER — NAPROXEN 500 MG PO TABS: 500.0000 mg | ORAL_TABLET | Freq: Two times a day (BID) | ORAL | 0 refills | Status: AC

## 2021-08-29 NOTE — ED Triage Notes (Signed)
Pt comes with c/o right hand pain. Pt states this started few weeks ago. Pt denies any known injuries.

## 2021-08-29 NOTE — ED Provider Notes (Signed)
[facility] Provider Note    Event Date/Time   First MD Initiated Contact with Patient 08/29/21 1408     (approximate)   History   Hand Pain   HPI  Craig Berg is a 31 y.o. male  here with R hand pain. Pt reports that he lifted a cast iron tub several days ago for a friend. Since then, he's had aching, throbbing pain in his hand. Pain is over his fourth finger/ring finger, hand. No direct trauma. No numbness or weakness. Pain is aching, throbbing, worse with movement. No h/o injuries to the area. It is worse along palmar/flexor surface.        Physical Exam   Triage Vital Signs: ED Triage Vitals  Enc Vitals Group     BP 08/29/21 1240 129/73     Pulse Rate 08/29/21 1240 74     Resp 08/29/21 1240 20     Temp 08/29/21 1240 98.1 F (36.7 C)     Temp Source 08/29/21 1240 Oral     SpO2 08/29/21 1240 97 %     Weight 08/29/21 1239 231 lb (104.8 kg)     Height 08/29/21 1239 5\' 11"  (1.803 m)     Head Circumference --      Peak Flow --      Pain Score 08/29/21 1226 8     Pain Loc --      Pain Edu? --      Excl. in GC? --     Most recent vital signs: Vitals:   08/29/21 1240 08/29/21 1524  BP: 129/73 134/76  Pulse: 74 75  Resp: 20 17  Temp: 98.1 F (36.7 C)   SpO2: 97% 99%     General: Awake, no distress.  CV:  Good peripheral perfusion.  Resp:  Normal effort.  Abd:  No distention.  Other:    UPPER EXTREMITY EXAM: RIGHT  INSPECTION & PALPATION: Moderate TTP over MCP of fourth finger, worse along plantar aspect. No swelling. No warmth. No erythema. No pain with passive extension/flexion.  SENSORY: Sensation is intact to light touch in:  Superficial radial nerve distribution (dorsal first web space) Median nerve distribution (tip of index finger)   Ulnar nerve distribution (tip of small finger)     MOTOR:  + Motor posterior interosseous nerve (thumb IP extension) + Anterior interosseous nerve (thumb IP flexion, index finger DIP  flexion) + Radial nerve (wrist extension) + Median nerve (palpable firing thenar mass) + Ulnar nerve (palpable firing of first dorsal interosseous muscle)  VASCULAR: 2+ radial pulse Brisk capillary refill < 2 sec, fingers warm and well-perfused  TENDONS: Tested individual flexion at MCP, PIP and DIP joints of ring finger and this is intact Tested extension of DIP/PIP joints of ring finger and this is intact  ED Results / Procedures / Treatments   Labs (all labs ordered are listed, but only abnormal results are displayed) Labs Reviewed - No data to display   EKG  RADIOLOGY XR Hand: negative   I also independently reviewed and agree with radiologist interpretations.   PROCEDURES:  Critical Care performed: No   MEDICATIONS ORDERED IN ED: Medications - No data to display   IMPRESSION / MDM / ASSESSMENT AND PLAN / ED COURSE  I reviewed the triage vital signs and the nursing notes.  Ddx:  Differential includes the following, with pertinent life- or limb-threatening emergencies considered:  Flexor strain/sprain, tendonitis, occult bony injury, trigger finger, unlikely infection  Patient's presentation is most consistent with acute, uncomplicated illness.  MDM:  31 yo M here with R hand pain after lifting a cast iron tub. Suspect strain/tendonitis of flexor tendons overlying ring finger. No signs of rupture. Distal NV is intact. XR negative. Will place in removable splint and scheduled NSAIDs. Discussed importance of rest and no heavy lifting until healed. No signs of infection, no redness or warmth.    MEDICATIONS GIVEN IN ED: Medications - No data to display   Consults:     EMR reviewed       FINAL CLINICAL IMPRESSION(S) / ED DIAGNOSES   Final diagnoses:  Sprain of metacarpophalangeal (MCP) joint of right ring finger, initial encounter     Rx / DC Orders   ED Discharge Orders          Ordered    naproxen  (NAPROSYN) 500 MG tablet  2 times daily with meals        08/29/21 1518             Note:  This document was prepared using Dragon voice recognition software and may include unintentional dictation errors.   Shaune Pollack, MD 08/29/21 2231

## 2021-08-29 NOTE — Discharge Instructions (Signed)
Wear the wrist brace as often as possible for the next 7-10 days  Take the Naproxen with food twice a day for 10 days, even if pain is improving  No heavy lifting for 2 weeks

## 2021-08-29 NOTE — ED Notes (Signed)
Wrist brace applied to right wrist. Pt understands how to tighten and loosen.

## 2022-11-20 ENCOUNTER — Other Ambulatory Visit: Payer: Self-pay

## 2022-11-20 DIAGNOSIS — K0889 Other specified disorders of teeth and supporting structures: Secondary | ICD-10-CM | POA: Insufficient documentation

## 2022-11-20 DIAGNOSIS — K029 Dental caries, unspecified: Secondary | ICD-10-CM | POA: Insufficient documentation

## 2022-11-20 DIAGNOSIS — R220 Localized swelling, mass and lump, head: Secondary | ICD-10-CM | POA: Diagnosis present

## 2022-11-20 DIAGNOSIS — R519 Headache, unspecified: Secondary | ICD-10-CM | POA: Diagnosis not present

## 2022-11-20 NOTE — ED Triage Notes (Signed)
Pt reports pain to right side jaw and into ear and head. Pt has had a tooth ache and jaw pain for the last month but reports it got worse tonight. Swelling noted below right ear.

## 2022-11-21 ENCOUNTER — Emergency Department
Admission: EM | Admit: 2022-11-21 | Discharge: 2022-11-21 | Disposition: A | Discharge status: Home / Self Care | Payer: PRIVATE HEALTH INSURANCE | Attending: Emergency Medicine | Admitting: Emergency Medicine

## 2022-11-21 DIAGNOSIS — K047 Periapical abscess without sinus: Secondary | ICD-10-CM

## 2022-11-21 DIAGNOSIS — K029 Dental caries, unspecified: Secondary | ICD-10-CM

## 2022-11-21 MED ORDER — OXYCODONE-ACETAMINOPHEN 5-325 MG PO TABS: 2.0000 | ORAL_TABLET | Freq: Three times a day (TID) | ORAL | 0 refills | Status: AC | PRN

## 2022-11-21 MED ORDER — OXYCODONE-ACETAMINOPHEN 5-325 MG PO TABS
2.0000 | ORAL_TABLET | Freq: Once | ORAL | Status: AC
  Administered 2022-11-21: 2 via ORAL
  Filled 2022-11-21: qty 2

## 2022-11-21 MED ORDER — KETOROLAC TROMETHAMINE 30 MG/ML IJ SOLN
30.0000 mg | Freq: Once | INTRAMUSCULAR | Status: AC
  Administered 2022-11-21: 30 mg via INTRAMUSCULAR
  Filled 2022-11-21: qty 1

## 2022-11-21 MED ORDER — AMOXICILLIN-POT CLAVULANATE 875-125 MG PO TABS: 1.0000 | ORAL_TABLET | Freq: Two times a day (BID) | ORAL | 0 refills | Status: AC

## 2022-11-21 MED ORDER — AMOXICILLIN-POT CLAVULANATE 875-125 MG PO TABS
1.0000 | ORAL_TABLET | Freq: Once | ORAL | Status: AC
  Administered 2022-11-21: 1 via ORAL
  Filled 2022-11-21: qty 1

## 2022-11-21 MED ORDER — CHLORHEXIDINE GLUCONATE 0.12 % MT SOLN: 15.0000 mL | Freq: Two times a day (BID) | OROMUCOSAL | 0 refills | Status: AC

## 2022-11-21 MED ORDER — DEXAMETHASONE 10 MG/ML FOR PEDIATRIC ORAL USE
10.0000 mg | Freq: Once | INTRAMUSCULAR | Status: AC
  Administered 2022-11-21: 10 mg via ORAL
  Filled 2022-11-21: qty 1

## 2022-11-21 NOTE — ED Notes (Signed)
..  The patient is A&OX4, ambulatory at d/c with independent steady gait, NAD. Pt verbalized understanding of d/c instructions, prescriptions and follow up care. Pts wife reports she is driving him home.

## 2022-11-21 NOTE — ED Provider Notes (Signed)
[facility] Provider Note    Event Date/Time   First MD Initiated Contact with Patient 11/21/22 [phone]     (approximate)   History   Facial Swelling and Facial Pain   HPI Craig Berg is a 32 y.o. male who presents for evaluation of pain in the right lower rear part of his jaw with some facial pain and swelling.  He said he knows he has chronic dental problems and he has had pain back there for months, but it has gotten much worse over the last 24 hours.  He is has some swelling in the area to it he seems to have pain radiating throughout his face.  He has dental insurance and his wife is going to call around to find a dentist for him, but he did not think he can or should wait until tomorrow.  No fever, no difficulty opening his mouth, no difficulty swallowing.     Physical Exam   Triage Vital Signs: ED Triage Vitals  Encounter Vitals Group     BP 11/20/22 2355 (!) 151/95     Systolic BP Percentile --      Diastolic BP Percentile --      Pulse Rate 11/20/22 2355 63     Resp 11/20/22 2355 18     Temp 11/20/22 2355 (!) 97.3 F (36.3 C)     Temp Source 11/20/22 2355 Oral     SpO2 11/20/22 2355 99 %     Weight --      Height 11/20/22 2354 1.803 m (5\' 11" )     Head Circumference --      Peak Flow --      Pain Score 11/20/22 2354 10     Pain Loc --      Pain Education --      Exclude from Growth Chart --     Most recent vital signs: Vitals:   11/21/22 0122 11/21/22 0141  BP: (!) 155/106 (!) 141/97  Pulse: 73   Resp: 18 18  Temp:    SpO2: 98%     General: Awake, appears uncomfortable but is nontoxic and not in severe distress. CV:  Good peripheral perfusion.  Resp:  Normal effort. Speaking easily and comfortably, no accessory muscle usage nor intercostal retractions.   Abd:  No distention.  HEENT: Chronically poor dentition throughout his mouth.  The right lower rear molars and possibly a partially erupted wisdom tooth are very tender to  palpation of the teeth and surrounding tissue, but there is no obvious drainable abscess.  He has what appears to be some swelling around the TMJ that is slightly tender to palpation but no fluctuance and no induration, just some generalized swelling.  No tenderness to palpation of the mastoid process and no submandibular tenderness or induration is appreciated.  No trismus.  No evidence of Ludwig's angina.   ED Results / Procedures / Treatments   Labs (all labs ordered are listed, but only abnormal results are displayed) Labs Reviewed - No data to display   PROCEDURES:  Critical Care performed: No  Procedures    IMPRESSION / MDM / ASSESSMENT AND PLAN / ED COURSE  I reviewed the triage vital signs and the nursing notes.                              Differential diagnosis includes, but is not limited to, dental caries, dental abscess, odontogenic infection  such as Ludwig's angina or facial abscess, developing facial cellulitis, deep neck space infection.  Patient's presentation is most consistent with acute presentation with potential threat to life or bodily function.   Interventions/Medications given:  Medications  oxyCODONE-acetaminophen (PERCOCET/ROXICET) 5-325 MG per tablet 2 tablet (2 tablets Oral Given 11/21/22 0122)  amoxicillin-clavulanate (AUGMENTIN) 875-125 MG per tablet 1 tablet (1 tablet Oral Given 11/21/22 0122)  ketorolac (TORADOL) 30 MG/ML injection 30 mg (30 mg Intramuscular Given 11/21/22 0126)  dexamethasone (DECADRON) 10 MG/ML injection for Pediatric ORAL use 10 mg (10 mg Oral Given 11/21/22 0123)    (Note:  hospital course my include additional interventions and/or labs/studies not listed above.)   Patient with developing odontogenic infection.  I discussed obtaining a CT of his face/neck with IV contrast to rule out abscess, but I also explained that I think he can be appropriately treated with oral antibiotics and that the studies would be expensive.  He would  prefer to try outpatient management and close dental follow-up rather than getting any imaging or unnecessary blood work.  I think that is very reasonable and appropriate.  Medications as listed above and prescription as listed below.  His wife is coming to help him by scheduling a follow-up appointment with a dentist at the next available opportunity.  I gave strict return precautions.  Patient is medically cleared and stable for discharge and outpatient management.         FINAL CLINICAL IMPRESSION(S) / ED DIAGNOSES   Final diagnoses:  Pain due to dental caries  Dental infection     Rx / DC Orders   ED Discharge Orders          Ordered    amoxicillin-clavulanate (AUGMENTIN) 875-125 MG tablet  2 times daily        11/21/22 0126    chlorhexidine (PERIDEX) 0.12 % solution  2 times daily        11/21/22 0126    oxyCODONE-acetaminophen (PERCOCET) 5-325 MG tablet  Every 8 hours PRN        11/21/22 0127             Note:  This document was prepared using Dragon voice recognition software and may include unintentional dictation errors.   Loleta Rose, MD 11/21/22 [phone]

## 2022-11-21 NOTE — Discharge Instructions (Addendum)
You have been seen in the Emergency Department (ED) today for dental pain.  Please take your prescribed antibiotic.  You may take pain medication as needed but ONLY as prescribed.  You should also take over-the-counter pain medication such as ibuprofen according to the label instructions unless a doctor has previously told you to avoid this type of medication (due to stomach ulcers, for example).  Alternatively you can take ibuprofen 600 mg by mouth three times daily with meals for no more than 5 days.  Take Percocet as prescribed for severe pain. Do not drink alcohol, drive or participate in any other potentially dangerous activities while taking this medication as it may make you sleepy. Do not take this medication with any other sedating medications, either prescription or over-the-counter. If you were prescribed Percocet or Vicodin, do not take these with acetaminophen (Tylenol) as it is already contained within these medications.   This medication is an opiate (or narcotic) pain medication and can be habit forming.  Use it as little as possible to achieve adequate pain control.  Do not use or use it with extreme caution if you have a history of opiate abuse or dependence.  If you are on a pain contract with your primary care doctor or a pain specialist, be sure to let them know you were prescribed this medication today from the [facility] Emergency Department.  This medication is intended for your use only - do not give any to anyone else and keep it in a secure place where nobody else, especially children, have access to it.  It will also cause or worsen constipation, so you may want to consider taking an over-the-counter stool softener while you are taking this medication.  Please see you dentist as soon as possible; only a dentist will be able to fix your problem(s).  Please see below for dental follow up options.  Return to the ED if you develop worsening pain, fever, pus/drainage, difficulty  breathing, or other symptoms that concern you.

## 2023-05-22 ENCOUNTER — Other Ambulatory Visit: Payer: Self-pay

## 2023-05-22 ENCOUNTER — Emergency Department
Admission: EM | Admit: 2023-05-22 | Discharge: 2023-05-22 | Disposition: A | Discharge status: Home / Self Care | Payer: Self-pay | Attending: Emergency Medicine | Admitting: Emergency Medicine

## 2023-05-22 DIAGNOSIS — L237 Allergic contact dermatitis due to plants, except food: Secondary | ICD-10-CM | POA: Diagnosis not present

## 2023-05-22 DIAGNOSIS — R21 Rash and other nonspecific skin eruption: Secondary | ICD-10-CM | POA: Diagnosis present

## 2023-05-22 MED ORDER — CLOBETASOL PROPIONATE 0.05 % EX CREA: 1.0000 | TOPICAL_CREAM | Freq: Two times a day (BID) | CUTANEOUS | 0 refills | Status: AC

## 2023-05-22 MED ORDER — PREDNISONE 10 MG (21) PO TBPK: ORAL_TABLET | ORAL | 0 refills | Status: AC

## 2023-05-22 NOTE — Discharge Instructions (Signed)
 Please take the steroids as prescribed.  Apply the topical steroid twice daily to the rash.  Do not put it on your face.  Return to the emergency department with any worsening symptoms.

## 2023-05-22 NOTE — ED Triage Notes (Signed)
 Red, itchy rash to left arm, bilateral legs and face.

## 2023-05-22 NOTE — ED Provider Notes (Signed)
 [facility] Provider Note    Event Date/Time   First MD Initiated Contact with Patient 05/22/23 1741     (approximate)   History   Rash   HPI  Craig Berg is a 33 y.o. male with no PMH who presents for evaluation of a rash to the arms bilateral legs and feet.  Patient states that the rash began yesterday.  He describes it as being intensely itchy.  Patient believes that it is from poison oak or poison ivy exposure, likely from his dogs.  No fevers.  Patient tried over-the-counter treatment without relief.      Physical Exam   Triage Vital Signs: ED Triage Vitals  Encounter Vitals Group     BP 05/22/23 1603 (!) 152/103     Systolic BP Percentile --      Diastolic BP Percentile --      Pulse Rate 05/22/23 1603 76     Resp 05/22/23 1603 18     Temp 05/22/23 1603 97.7 F (36.5 C)     Temp Source 05/22/23 1603 Oral     SpO2 05/22/23 1603 98 %     Weight --      Height --      Head Circumference --      Peak Flow --      Pain Score 05/22/23 1603 0     Pain Loc --      Pain Education --      Exclude from Growth Chart --     Most recent vital signs: Vitals:   05/22/23 1603  BP: (!) 152/103  Pulse: 76  Resp: 18  Temp: 97.7 F (36.5 C)  SpO2: 98%    General: Awake, no distress.  CV:  Good peripheral perfusion.  Resp:  Normal effort.  Abd:  No distention.  Other:  Erythematous papular rash with developing vesicles on the left forearm, right leg and right foot.  Does blanch with pressure.   ED Results / Procedures / Treatments   Labs (all labs ordered are listed, but only abnormal results are displayed) Labs Reviewed - No data to display   PROCEDURES:  Critical Care performed: No  Procedures   MEDICATIONS ORDERED IN ED: Medications - No data to display   IMPRESSION / MDM / ASSESSMENT AND PLAN / ED COURSE  I reviewed the triage vital signs and the nursing notes.                             33 year old male presents for  evaluation of rash.  She is elevated otherwise vital signs are stable.  Patient NAD on exam.  Differential diagnosis includes, but is not limited to, contact dermatitis, allergic dermatitis,  Patient's presentation is most consistent with acute, uncomplicated illness.  Rash is consistent with contact dermatitis seen as a result of poison ivy or poison oak exposure.  Patient will be treated with both oral steroids and topical steroids.  Advised him to follow-up with his primary care provider if symptoms are not improving with this treatment.  Patient voiced understanding, questions were answered and he was stable at discharge.     FINAL CLINICAL IMPRESSION(S) / ED DIAGNOSES   Final diagnoses:  Allergic contact dermatitis due to plants, except food     Rx / DC Orders   ED Discharge Orders          Ordered    predniSONE (STERAPRED UNI-PAK 21  TAB) 10 MG (21) TBPK tablet  Daily        05/22/23 1802    clobetasol cream (TEMOVATE) 0.05 %  2 times daily        05/22/23 1802             Note:  This document was prepared using Dragon voice recognition software and may include unintentional dictation errors.   Cameron Ali, PA-C 05/22/23 1803    Minna Antis, MD 05/22/23 [phone]
